# Patient Record
Sex: Female | Born: 1955 | Race: Black or African American | Hispanic: No | Smoking: Former smoker
Health system: Southern US, Community
[De-identification: ages and names within clinical notes are randomized; demographics above are authoritative.]

## PROBLEM LIST (undated history)

## (undated) DIAGNOSIS — E079 Disorder of thyroid, unspecified: Secondary | ICD-10-CM

## (undated) HISTORY — DX: Disorder of thyroid, unspecified: E07.9

## (undated) HISTORY — PX: THYROIDECTOMY: SHX17

---

## 2019-08-16 ENCOUNTER — Other Ambulatory Visit: Payer: Self-pay | Admitting: Internal Medicine

## 2019-08-16 DIAGNOSIS — Z13820 Encounter for screening for osteoporosis: Secondary | ICD-10-CM

## 2019-08-16 DIAGNOSIS — Z1231 Encounter for screening mammogram for malignant neoplasm of breast: Secondary | ICD-10-CM

## 2019-08-29 ENCOUNTER — Ambulatory Visit
Admission: RE | Admit: 2019-08-29 | Discharge: 2019-08-29 | Disposition: A | Discharge status: Home / Self Care | Payer: Medicare (Managed Care) | Source: Ambulatory Visit | Attending: Internal Medicine | Admitting: Internal Medicine

## 2019-08-29 ENCOUNTER — Other Ambulatory Visit: Payer: Self-pay

## 2019-08-29 DIAGNOSIS — Z1231 Encounter for screening mammogram for malignant neoplasm of breast: Secondary | ICD-10-CM

## 2020-02-12 ENCOUNTER — Ambulatory Visit
Admission: RE | Admit: 2020-02-12 | Discharge: 2020-02-12 | Disposition: A | Discharge status: Home / Self Care | Payer: Medicare (Managed Care) | Source: Ambulatory Visit | Attending: Internal Medicine | Admitting: Internal Medicine

## 2020-02-12 ENCOUNTER — Other Ambulatory Visit: Payer: Self-pay

## 2020-02-12 DIAGNOSIS — Z13820 Encounter for screening for osteoporosis: Secondary | ICD-10-CM

## 2020-08-09 DIAGNOSIS — M859 Disorder of bone density and structure, unspecified: Secondary | ICD-10-CM | POA: Diagnosis not present

## 2020-08-09 DIAGNOSIS — Z0000 Encounter for general adult medical examination without abnormal findings: Secondary | ICD-10-CM | POA: Diagnosis not present

## 2020-08-15 ENCOUNTER — Other Ambulatory Visit: Payer: Self-pay | Admitting: Internal Medicine

## 2020-08-15 DIAGNOSIS — Z0000 Encounter for general adult medical examination without abnormal findings: Secondary | ICD-10-CM | POA: Diagnosis not present

## 2020-08-15 DIAGNOSIS — Z1331 Encounter for screening for depression: Secondary | ICD-10-CM | POA: Diagnosis not present

## 2020-08-15 DIAGNOSIS — Z1339 Encounter for screening examination for other mental health and behavioral disorders: Secondary | ICD-10-CM | POA: Diagnosis not present

## 2020-08-15 DIAGNOSIS — E049 Nontoxic goiter, unspecified: Secondary | ICD-10-CM | POA: Diagnosis not present

## 2020-08-15 DIAGNOSIS — E663 Overweight: Secondary | ICD-10-CM | POA: Diagnosis not present

## 2020-08-15 DIAGNOSIS — Z23 Encounter for immunization: Secondary | ICD-10-CM | POA: Diagnosis not present

## 2020-08-15 DIAGNOSIS — Z6829 Body mass index (BMI) 29.0-29.9, adult: Secondary | ICD-10-CM | POA: Diagnosis not present

## 2020-10-02 ENCOUNTER — Ambulatory Visit
Admission: RE | Admit: 2020-10-02 | Discharge: 2020-10-02 | Disposition: A | Discharge status: Home / Self Care | Payer: Medicare (Managed Care) | Source: Ambulatory Visit | Attending: Internal Medicine | Admitting: Internal Medicine

## 2020-10-02 DIAGNOSIS — E041 Nontoxic single thyroid nodule: Secondary | ICD-10-CM | POA: Diagnosis not present

## 2020-10-02 DIAGNOSIS — E049 Nontoxic goiter, unspecified: Secondary | ICD-10-CM

## 2020-10-07 DIAGNOSIS — Z7722 Contact with and (suspected) exposure to environmental tobacco smoke (acute) (chronic): Secondary | ICD-10-CM | POA: Diagnosis not present

## 2020-10-07 DIAGNOSIS — Z809 Family history of malignant neoplasm, unspecified: Secondary | ICD-10-CM | POA: Diagnosis not present

## 2020-10-07 DIAGNOSIS — R030 Elevated blood-pressure reading, without diagnosis of hypertension: Secondary | ICD-10-CM | POA: Diagnosis not present

## 2020-10-07 DIAGNOSIS — I951 Orthostatic hypotension: Secondary | ICD-10-CM | POA: Diagnosis not present

## 2020-10-07 DIAGNOSIS — Z87891 Personal history of nicotine dependence: Secondary | ICD-10-CM | POA: Diagnosis not present

## 2020-10-07 DIAGNOSIS — E669 Obesity, unspecified: Secondary | ICD-10-CM | POA: Diagnosis not present

## 2020-10-07 DIAGNOSIS — Z6830 Body mass index (BMI) 30.0-30.9, adult: Secondary | ICD-10-CM | POA: Diagnosis not present

## 2020-10-28 ENCOUNTER — Other Ambulatory Visit: Payer: Self-pay | Admitting: Internal Medicine

## 2020-10-28 DIAGNOSIS — Z1231 Encounter for screening mammogram for malignant neoplasm of breast: Secondary | ICD-10-CM

## 2020-11-21 DIAGNOSIS — E042 Nontoxic multinodular goiter: Secondary | ICD-10-CM | POA: Diagnosis not present

## 2020-12-05 DIAGNOSIS — E042 Nontoxic multinodular goiter: Secondary | ICD-10-CM | POA: Diagnosis not present

## 2020-12-18 ENCOUNTER — Ambulatory Visit: Payer: Medicare HMO

## 2020-12-23 ENCOUNTER — Ambulatory Visit (INDEPENDENT_AMBULATORY_CARE_PROVIDER_SITE_OTHER): Payer: Medicare HMO | Admitting: Endocrinology

## 2020-12-23 ENCOUNTER — Other Ambulatory Visit: Payer: Self-pay

## 2020-12-23 ENCOUNTER — Encounter: Payer: Self-pay | Admitting: Endocrinology

## 2020-12-23 DIAGNOSIS — E042 Nontoxic multinodular goiter: Secondary | ICD-10-CM | POA: Diagnosis not present

## 2020-12-23 NOTE — Patient Instructions (Signed)
Please come back for a follow-up appointment in 6 weeks (after the surgery).

## 2020-12-23 NOTE — Progress Notes (Addendum)
   Subjective:    Patient ID: Stephanie Parker, female    DOB: 1955-09-25, 65 y.o.   MRN: [id number]  HPI Pt is referred by Dr Jacalyn Lefevre, for nodular thyroid.  Pt was noted to have a thyroid nodule in 2021.   she has no h/o XRT or surgery to the neck. She does not notice the goiter   Social History   Socioeconomic History   Marital status: Widowed    Spouse name: Not on file   Number of children: Not on file   Years of education: Not on file   Highest education level: Not on file  Occupational History   Not on file  Tobacco Use   Smoking status: Not on file   Smokeless tobacco: Not on file  Substance and Sexual Activity   Alcohol use: Not on file   Drug use: Not on file   Sexual activity: Not on file  Other Topics Concern   Not on file  Social History Narrative   Not on file   Social Determinants of Health   Financial Resource Strain: Not on file  Food Insecurity: Not on file  Transportation Needs: Not on file  Physical Activity: Not on file  Stress: Not on file  Social Connections: Not on file  Intimate Partner Violence: Not on file    No current outpatient medications on file prior to visit.   No current facility-administered medications on file prior to visit.    No Known Allergies  Family History  Problem Relation Age of Onset   Thyroid disease Neg Hx     BP 120/60 (BP Location: Right Arm, Patient Position: Sitting, Cuff Size: Normal)   Pulse 86   Ht 5\' 4"  (1.626 m)   Wt 190 lb 6.4 oz (86.4 kg)   SpO2 94%   BMI 32.68 kg/m    Review of Systems Denies hoarseness, neck pain, sob.  She has gained a few lbs.     Objective:   Physical Exam NECK: thyroids is severely enlarged, with irreg surface, but I can't tell details.     [place] (2022):  Massively enlarged, heterogeneous, lobular and multinodular goiter with numerous similar appearing thyroid nodules and regions of pseudo nodularity. 2. At least 4 nodules technically meet criteria to consider fine-needle  aspiration biopsy given their very large size. However, current recommendations suggest that no more than 2 nodules be biopsied at a time. Therefore, nodules #9 (2.9 cm TI-RADS category 4 nodule in the left mid gland) and nodule #2 (4.5 cm TI-RADS category 3 nodule in the mid isthmus) should be considered for fine-needle aspiration biopsy. The remaining nodules can be followed with repeat thyroid ultrasound in 1 year.  TSH=0.69 Cytol (2022): both nodules are cat 1.  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  she was advised to have surgery, but she requested to discuss with Dr Jacalyn Lefevre first.      Assessment & Plan:  MNG, new to me.  uncertain etiology and prognosis.  We discussed options.  Pt chooses surgery over repeat bx.   Patient Instructions  Please come back for a follow-up appointment in 6 weeks (after the surgery).

## 2020-12-24 DIAGNOSIS — E042 Nontoxic multinodular goiter: Secondary | ICD-10-CM | POA: Insufficient documentation

## 2021-01-27 DIAGNOSIS — E042 Nontoxic multinodular goiter: Secondary | ICD-10-CM | POA: Diagnosis not present

## 2021-02-05 ENCOUNTER — Other Ambulatory Visit: Payer: Self-pay

## 2021-02-05 ENCOUNTER — Ambulatory Visit (INDEPENDENT_AMBULATORY_CARE_PROVIDER_SITE_OTHER): Payer: Medicare HMO | Admitting: Endocrinology

## 2021-02-05 VITALS — BP 156/80 | HR 98 | Ht 64.0 in | Wt 191.6 lb

## 2021-02-05 DIAGNOSIS — E042 Nontoxic multinodular goiter: Secondary | ICD-10-CM

## 2021-02-05 LAB — BASIC METABOLIC PANEL
BUN: 8 mg/dL (ref 6–23)
CO2: 27 mEq/L (ref 19–32)
Calcium: 9.3 mg/dL (ref 8.4–10.5)
Chloride: 103 mEq/L (ref 96–112)
Creatinine, Ser: 0.63 mg/dL (ref 0.40–1.20)
GFR: 91.89 mL/min (ref >60.00)
Glucose, Bld: 90 mg/dL (ref 70–99)
Potassium: 3.6 mEq/L (ref 3.5–5.1)
Sodium: 139 mEq/L (ref 135–145)

## 2021-02-05 LAB — TSH: TSH: 0.43 u[IU]/mL (ref 0.35–5.50)

## 2021-02-05 LAB — T4, FREE: Free T4: 1.28 ng/dL (ref 0.60–1.60)

## 2021-02-05 MED ORDER — LEVOTHYROXINE SODIUM 137 MCG PO TABS: 137.0000 ug | ORAL_TABLET | Freq: Every day | ORAL | 3 refills | Status: DC

## 2021-02-05 NOTE — Progress Notes (Signed)
   Subjective:    Patient ID: Stephanie Parker, female    DOB: 02-01-1956, 65 y.o.   MRN: [id number]  HPI Pt returns for f/u of thyroidect for MNG (2022). pt states she feels well in general, except slight numbness at the scar area.  She had thyroidect 9 days ago.  She takes Ca++/vit-D pills.  Social History   Socioeconomic History   Marital status: Widowed    Spouse name: Not on file   Number of children: Not on file   Years of education: Not on file   Highest education level: Not on file  Occupational History   Not on file  Tobacco Use   Smoking status: Not on file   Smokeless tobacco: Not on file  Substance and Sexual Activity   Alcohol use: Not on file   Drug use: Not on file   Sexual activity: Not on file  Other Topics Concern   Not on file  Social History Narrative   Not on file   Social Determinants of Health   Financial Resource Strain: Not on file  Food Insecurity: Not on file  Transportation Needs: Not on file  Physical Activity: Not on file  Stress: Not on file  Social Connections: Not on file  Intimate Partner Violence: Not on file    No current outpatient medications on file prior to visit.   No current facility-administered medications on file prior to visit.    No Known Allergies  Family History  Problem Relation Age of Onset   Thyroid disease Neg Hx     BP (!) 156/80 (BP Location: Right Arm, Patient Position: Sitting, Cuff Size: Large)   Pulse 98   Ht 5\' 4"  (1.626 m)   Wt 191 lb 9.6 oz (86.9 kg)   SpO2 96%   BMI 32.89 kg/m    Review of Systems Denies acral tingling    Objective:   Physical Exam VITAL SIGNS:  See vs page.   GENERAL: no distress.   Neck: a healing scar is present.  I do not appreciate a nodule in the thyroid or elsewhere in the neck.     Pathol: THYROID, LEFT AND ISTHMUS, LOBECTOMY:              Multinodular hyperplasia.    B. THYROID, RIGHT, LOBECTOMY:              Multinodular hyperplasia.   Lab Results   Component Value Date   TSH 0.43 02/05/2021      Assessment & Plan:  Hypothyroidism: well-controlled.  Please continue the same synthroid Thyroidect: check BMET, including Ca++.

## 2021-02-05 NOTE — Patient Instructions (Addendum)
Blood tests are requested for you today.  We'll let you know about the results.  The numbness at the scar area will go away with time.  I would be happy to see you back here as needed.

## 2021-02-20 DIAGNOSIS — E890 Postprocedural hypothyroidism: Secondary | ICD-10-CM | POA: Diagnosis not present

## 2021-02-20 DIAGNOSIS — Z4889 Encounter for other specified surgical aftercare: Secondary | ICD-10-CM | POA: Diagnosis not present

## 2021-04-02 DIAGNOSIS — M542 Cervicalgia: Secondary | ICD-10-CM | POA: Diagnosis not present

## 2021-05-01 DIAGNOSIS — Z9889 Other specified postprocedural states: Secondary | ICD-10-CM | POA: Diagnosis not present

## 2021-05-01 DIAGNOSIS — E042 Nontoxic multinodular goiter: Secondary | ICD-10-CM | POA: Diagnosis not present

## 2021-06-20 DIAGNOSIS — E890 Postprocedural hypothyroidism: Secondary | ICD-10-CM | POA: Diagnosis not present

## 2021-06-20 DIAGNOSIS — Z79899 Other long term (current) drug therapy: Secondary | ICD-10-CM | POA: Diagnosis not present

## 2021-06-26 ENCOUNTER — Encounter: Payer: Self-pay | Admitting: Internal Medicine

## 2021-08-04 ENCOUNTER — Ambulatory Visit (AMBULATORY_SURGERY_CENTER): Payer: Medicaid Other | Admitting: *Deleted

## 2021-08-04 VITALS — Ht 64.0 in | Wt 196.0 lb

## 2021-08-04 DIAGNOSIS — Z1211 Encounter for screening for malignant neoplasm of colon: Secondary | ICD-10-CM

## 2021-08-04 MED ORDER — PEG 3350-KCL-NA BICARB-NACL 420 G PO SOLR: 4000.0000 mL | Freq: Once | ORAL | 0 refills | Status: AC

## 2021-08-04 NOTE — Progress Notes (Signed)

## 2021-08-25 ENCOUNTER — Ambulatory Visit (AMBULATORY_SURGERY_CENTER): Payer: Medicare Other | Admitting: Internal Medicine

## 2021-08-25 ENCOUNTER — Encounter: Payer: Self-pay | Admitting: Internal Medicine

## 2021-08-25 VITALS — BP 141/82 | HR 60 | Temp 96.9°F | Resp 13 | Ht 64.0 in | Wt 196.0 lb

## 2021-08-25 DIAGNOSIS — D125 Benign neoplasm of sigmoid colon: Secondary | ICD-10-CM

## 2021-08-25 DIAGNOSIS — K6389 Other specified diseases of intestine: Secondary | ICD-10-CM | POA: Diagnosis not present

## 2021-08-25 DIAGNOSIS — Z1211 Encounter for screening for malignant neoplasm of colon: Secondary | ICD-10-CM

## 2021-08-25 DIAGNOSIS — D123 Benign neoplasm of transverse colon: Secondary | ICD-10-CM

## 2021-08-25 MED ORDER — SODIUM CHLORIDE 0.9 % IV SOLN: 500.0000 mL | Freq: Once | INTRAVENOUS | Status: DC

## 2021-08-25 NOTE — Progress Notes (Signed)
   GASTROENTEROLOGY PROCEDURE H&P NOTE   Primary Care Physician: Michael Boston, MD    Reason for Procedure:   Colon cancer screening  Plan:    Colonoscopy  Patient is appropriate for endoscopic procedure(s) in the ambulatory ([place]) setting.  The nature of the procedure, as well as the risks, benefits, and alternatives were carefully and thoroughly reviewed with the patient. Ample time for discussion and questions allowed. The patient understood, was satisfied, and agreed to proceed.     HPI: Stephanie Parker is a 66 y.o. female who presents for colonoscopy for colon cancer screening. Denies blood in stools, changes in bowel habits, weight loss. Denies fam hx of colon cancer.  Past Medical History:  Diagnosis Date   Thyroid disease     Past Surgical History:  Procedure Laterality Date   THYROIDECTOMY     NOVEMBER,2022    Prior to Admission medications   Medication Sig Start Date End Date Taking? Authorizing Provider  Calcium Carb-Cholecalciferol (CALCIUM 500 + D3 PO) Take by mouth daily. TAKE 2 [place]    [provider]  levothyroxine (SYNTHROID) 112 MCG tablet Take 112 mcg by mouth daily. 07/04/21   [provider]    Current Outpatient Medications  Medication Sig Dispense Refill   Calcium Carb-Cholecalciferol (CALCIUM 500 + D3 PO) Take by mouth daily. TAKE 2 GUMMIES DAILY     levothyroxine (SYNTHROID) 112 MCG tablet Take 112 mcg by mouth daily.     No current facility-administered medications for this visit.    Allergies as of 08/25/2021   (No Known Allergies)    Family History  Problem Relation Age of Onset   Esophageal cancer Brother    Thyroid disease Neg Hx    Colon cancer Neg Hx    Colon polyps Neg Hx    Crohn's disease Neg Hx    Rectal cancer Neg Hx    Stomach cancer Neg Hx     Social History   Socioeconomic History   Marital status: Widowed    Spouse name: Not on file   Number of children: Not on file   Years of  education: Not on file   Highest education level: Not on file  Occupational History   Not on file  Tobacco Use   Smoking status: Former    Types: Cigarettes    Passive exposure: Never   Smokeless tobacco: Never  Vaping Use   Vaping Use: Never used  Substance and Sexual Activity   Alcohol use: Not Currently   Drug use: Never   Sexual activity: Not on file  Other Topics Concern   Not on file  Social History Narrative   Not on file   Social Determinants of Health   Financial Resource Strain: Not on file  Food Insecurity: Not on file  Transportation Needs: Not on file  Physical Activity: Not on file  Stress: Not on file  Social Connections: Not on file  Intimate Partner Violence: Not on file    Physical Exam: Vital signs in last 24 hours: There were no vitals taken for this visit. GEN: NAD EYE: Sclerae anicteric ENT: MMM CV: Non-tachycardic Pulm: No increased work of breathing GI: Soft, NT/ND NEURO:  Alert & Oriented   Christia Reading, MD [facility]  08/25/2021 8:35 AM

## 2021-08-25 NOTE — Progress Notes (Signed)
Called to room to assist during endoscopic procedure.  Patient ID and intended procedure confirmed with present staff. Received instructions for my participation in the procedure from the performing physician.  

## 2021-08-25 NOTE — Progress Notes (Signed)
Pt non-responsive, VVS, Report to RN  °

## 2021-08-25 NOTE — Patient Instructions (Signed)
Hand out given for polyps.  Await pathology results.  Resume previous diet and current medications.   YOU HAD AN ENDOSCOPIC PROCEDURE TODAY AT [facility]:   Refer to the procedure report that was given to you for any specific questions about what was found during the examination.  If the procedure report does not answer your questions, please call your gastroenterologist to clarify.  If you requested that your care partner not be given the details of your procedure findings, then the procedure report has been included in a sealed envelope for you to review at your convenience later.  YOU SHOULD EXPECT: Some feelings of bloating in the abdomen. Passage of more gas than usual.  Walking can help get rid of the air that was put into your GI tract during the procedure and reduce the bloating. If you had a lower endoscopy (such as a colonoscopy or flexible sigmoidoscopy) you may notice spotting of blood in your stool or on the toilet paper. If you underwent a bowel prep for your procedure, you may not have a normal bowel movement for a few days.  Please Note:  You might notice some irritation and congestion in your nose or some drainage.  This is from the oxygen used during your procedure.  There is no need for concern and it should clear up in a day or so.  SYMPTOMS TO REPORT IMMEDIATELY:  Following lower endoscopy (colonoscopy or flexible sigmoidoscopy):  Excessive amounts of blood in the stool  Significant tenderness or worsening of abdominal pains  Swelling of the abdomen that is new, acute  Fever of 100F or higher   For urgent or emergent issues, a gastroenterologist can be reached at any hour by calling [phone]. Do not use MyChart messaging for urgent concerns.    DIET:  We do recommend a small meal at first, but then you may proceed to your regular diet.  Drink plenty of fluids but you should avoid alcoholic beverages for 24 hours.  ACTIVITY:  You should plan to take  it easy for the rest of today and you should NOT DRIVE or use heavy machinery until tomorrow (because of the sedation medicines used during the test).    FOLLOW UP: Our staff will call the number listed on your records 24-72 hours following your procedure to check on you and address any questions or concerns that you may have regarding the information given to you following your procedure. If we do not reach you, we will leave a message.  We will attempt to reach you two times.  During this call, we will ask if you have developed any symptoms of COVID 19. If you develop any symptoms (ie: fever, flu-like symptoms, shortness of breath, cough etc.) before then, please call [phone].  If you test positive for Covid 19 in the 2 weeks post procedure, please call and report this information to [place].    If any biopsies were taken you will be contacted by phone or by letter within the next 1-3 weeks.  Please call us at [phone] if you have not heard about the biopsies in 3 weeks.    SIGNATURES/CONFIDENTIALITY: You and/or your care partner have signed paperwork which will be entered into your electronic medical record.  These signatures attest to the fact that that the information above on your After Visit Summary has been reviewed and is understood.  Full responsibility of the confidentiality of this discharge information lies with you and/or your care-partner.

## 2021-08-25 NOTE — Progress Notes (Signed)
Pt's states no medical or surgical changes since previsit or office visit. 

## 2021-08-25 NOTE — Op Note (Signed)
[facility] Patient Name: [name] Procedure Date: 08/25/2021 9:16 AM MRN: [id number] Endoscopist: Sonny Masters "Stephanie Parker ,  Age: 66 Referring MD:  Date of Birth: May 07, 1955 Gender: Female Account #: [id number] Procedure:                Colonoscopy Indications:              Screening for colorectal malignant neoplasm, This                            is the patient's first colonoscopy Medicines:                Monitored Anesthesia Care Procedure:                Pre-Anesthesia Assessment:                           - Prior to the procedure, a History and Physical                            was performed, and patient medications and                            allergies were reviewed. The patient's tolerance of                            previous anesthesia was also reviewed. The risks                            and benefits of the procedure and the sedation                            options and risks were discussed with the patient.                            All questions were answered, and informed consent                            was obtained. Prior Anticoagulants: The patient has                            taken no previous anticoagulant or antiplatelet                            agents. ASA Grade Assessment: II - A patient with                            mild systemic disease. After reviewing the risks                            and benefits, the patient was deemed in                            satisfactory condition to undergo the procedure.  After obtaining informed consent, the colonoscope                            was passed under direct vision. Throughout the                            procedure, the patient's blood pressure, pulse, and                            oxygen saturations were monitored continuously. The                            CF [id number] #[id number] was introduced through the anus                            and advanced to the  the terminal ileum. The                            colonoscopy was performed without difficulty. The                            patient tolerated the procedure well. The quality                            of the bowel preparation was adequate. The terminal                            ileum, ileocecal valve, appendiceal orifice, and                            rectum were photographed. Scope In: 9:21:56 AM Scope Out: 9:51:53 AM Scope Withdrawal Time: 0 hours 26 minutes 25 seconds  Total Procedure Duration: 0 hours 29 minutes 57 seconds  Findings:                 The terminal ileum appeared normal.                           Four sessile polyps were found in the transverse                            colon. The polyps were 3 to 4 mm in size. These                            polyps were removed with a cold snare. Resection                            and retrieval were complete.                           Multiple small and large-mouthed diverticula were                            found in the sigmoid colon and descending colon.  A 10 mm polyp was found in the sigmoid colon. The                            polyp was pedunculated. The polyp was removed with                            a hot snare. Resection and retrieval were complete.                            Area distal and on the opposite wall was tattooed                            with an injection of Spot (carbon black).                           Non-bleeding internal hemorrhoids were found during                            retroflexion. Complications:            No immediate complications. Estimated Blood Loss:     Estimated blood loss was minimal. Impression:               - The examined portion of the ileum was normal.                           - Four 3 to 4 mm polyps in the transverse colon,                            removed with a cold snare. Resected and retrieved.                           - Diverticulosis in  the sigmoid colon and in the                            descending colon.                           - One 10 mm polyp in the sigmoid colon, removed                            with a hot snare. Resected and retrieved. Tattooed.                           - Non-bleeding internal hemorrhoids. Recommendation:           - Discharge patient to home (with escort).                           - Await pathology results.                           - The findings and recommendations were discussed  with the patient. Sonny Masters "Stephanie Parker,  08/25/2021 9:58:09 AM

## 2021-08-26 ENCOUNTER — Telehealth: Payer: Self-pay

## 2021-08-26 NOTE — Telephone Encounter (Signed)
  Follow up Call-     08/25/2021    8:47 AM  Call back number  Post procedure Call Back phone  # [phone]  Permission to leave phone message Yes     Patient questions:  Do you have a fever, pain , or abdominal swelling? No. Pain Score  0 *  Have you tolerated food without any problems? Yes.    Have you been able to return to your normal activities? Yes.    Do you have any questions about your discharge instructions: Diet   No. Medications  No. Follow up visit  No.  Do you have questions or concerns about your Care? No.  Actions: * If pain score is 4 or above: No action needed, pain <4.

## 2021-08-28 ENCOUNTER — Encounter: Payer: Self-pay | Admitting: Internal Medicine

## 2021-08-28 ENCOUNTER — Ambulatory Visit: Payer: Medicare Other

## 2021-09-02 ENCOUNTER — Ambulatory Visit
Admission: RE | Admit: 2021-09-02 | Discharge: 2021-09-02 | Disposition: A | Discharge status: Home / Self Care | Payer: Medicare Other | Source: Ambulatory Visit | Attending: Internal Medicine | Admitting: Internal Medicine

## 2021-09-02 DIAGNOSIS — Z1231 Encounter for screening mammogram for malignant neoplasm of breast: Secondary | ICD-10-CM

## 2022-06-21 IMAGING — US US THYROID
1 series · 12 of 25 positions shown · non-contrast
Comparison: None.

CLINICAL DATA: Palpable abnormality.  Thyromegaly

EXAM:
THYROID ULTRASOUND
TECHNIQUE: Ultrasound examination of the thyroid gland and adjacent soft
tissues was performed.

[Series 1: us thyroid · 0.09mm/px · 12 of 57 slices shown]
[im 3/57]
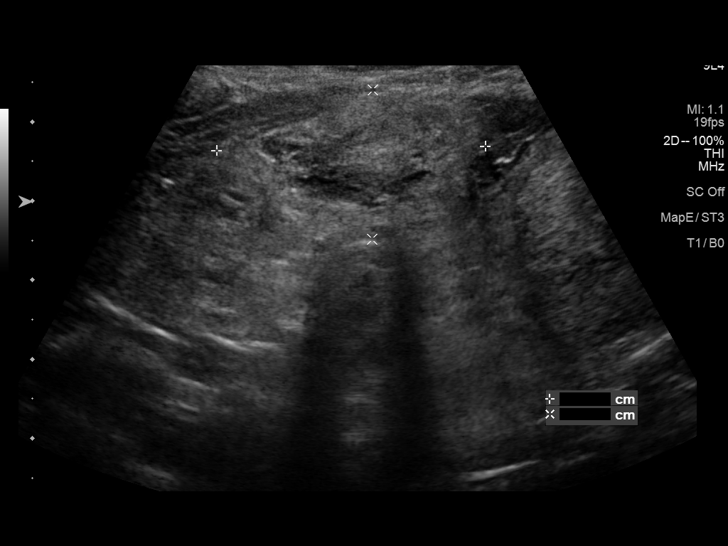
[im 8/57]
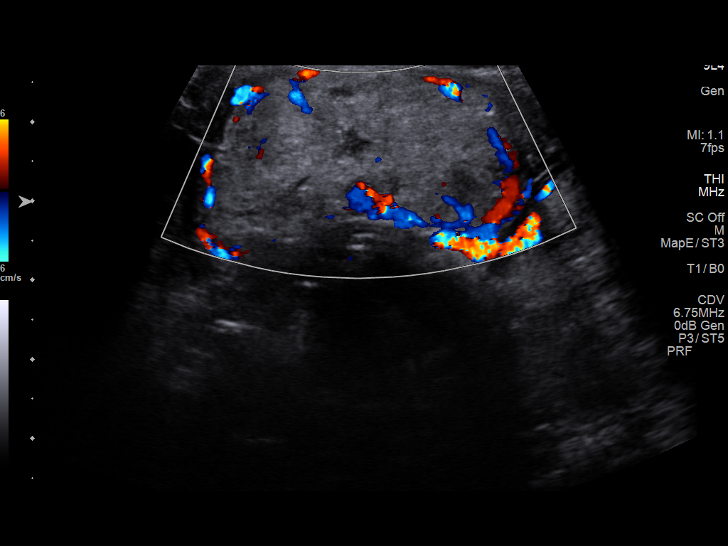
[im 12/57]
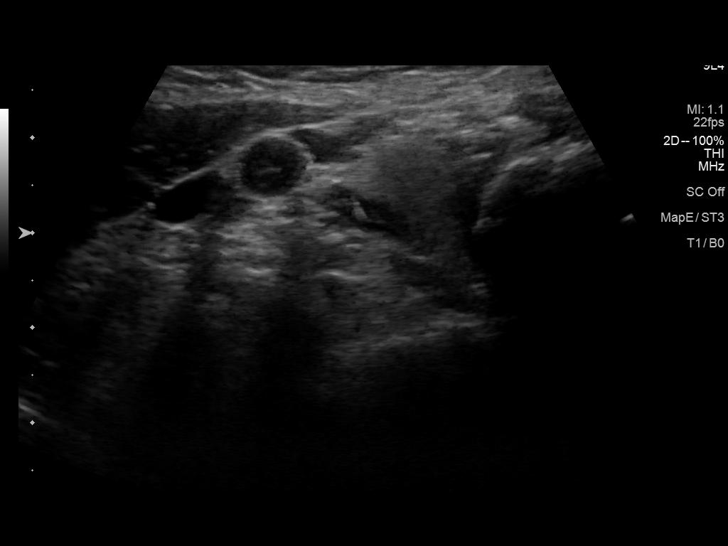
[im 17/57]
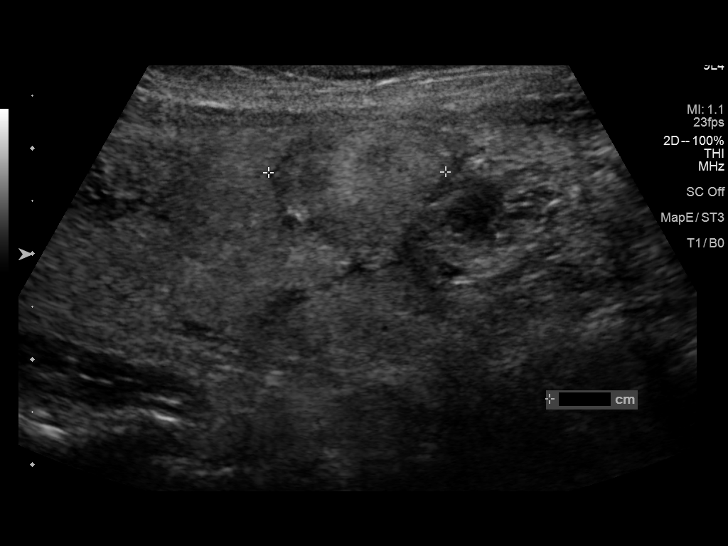
[im 22/57]
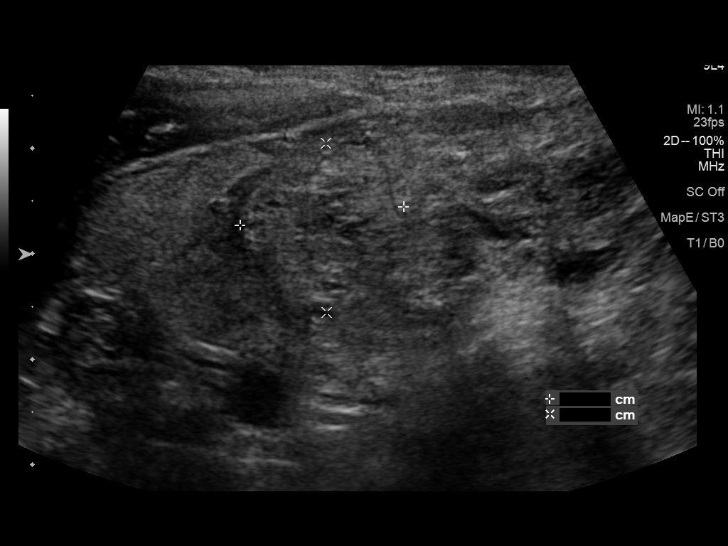
[im 26/57]
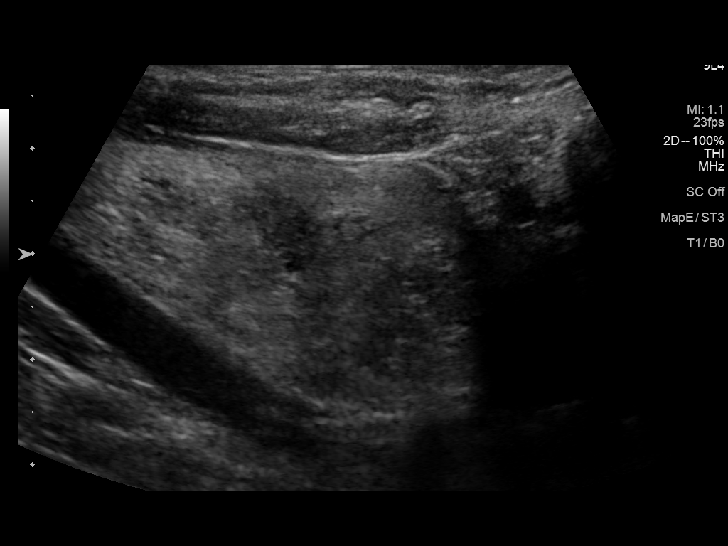
[im 31/57]
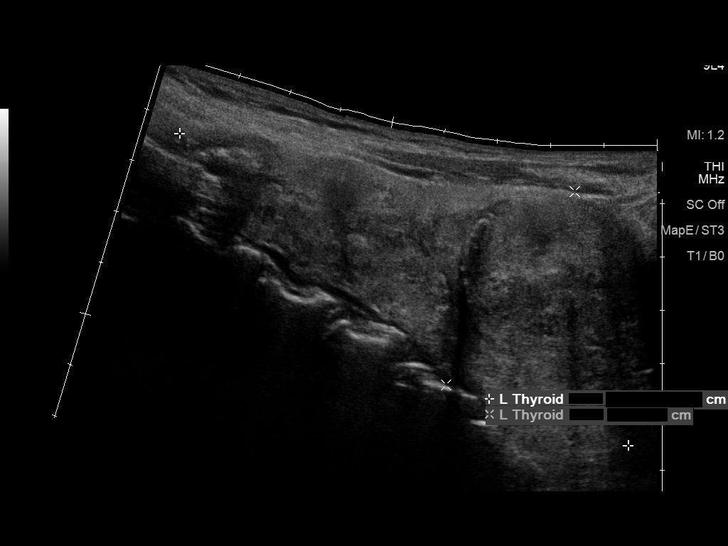
[im 36/57]
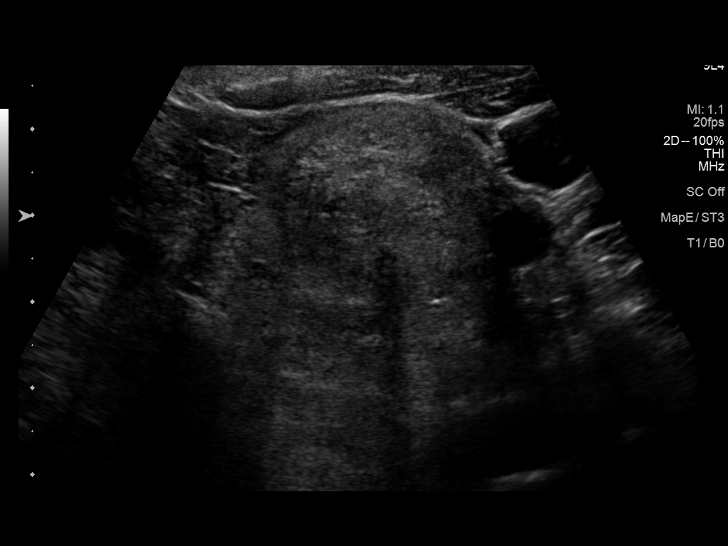
[im 40/57]
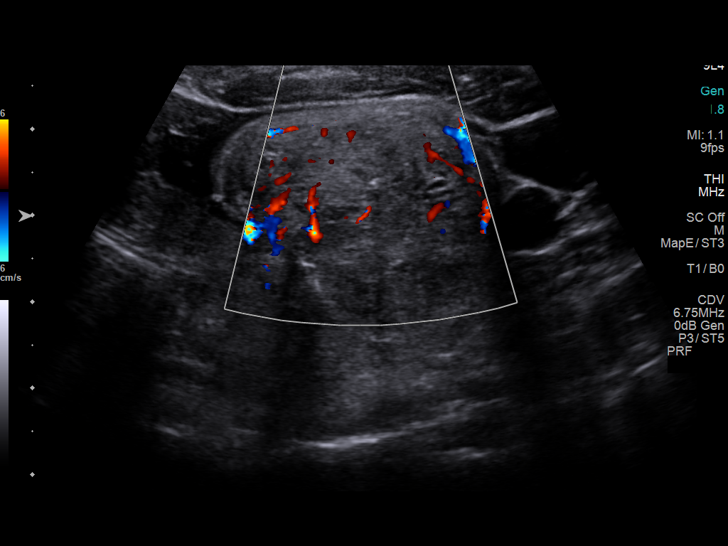
[im 45/57]
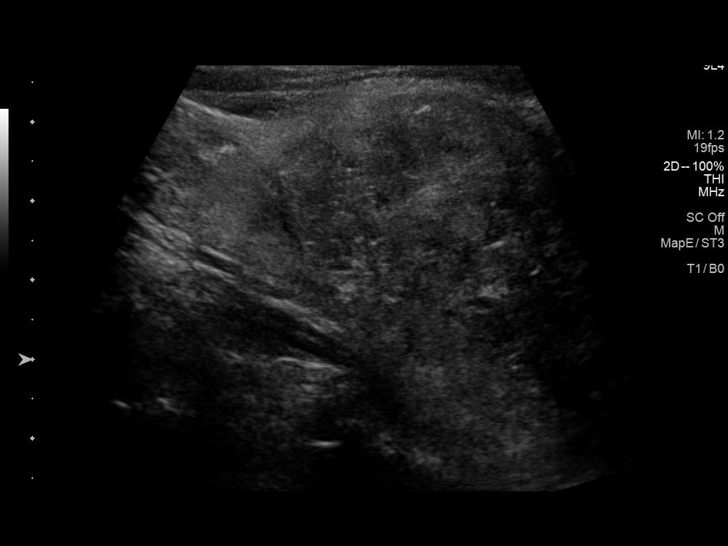
[im 50/57]
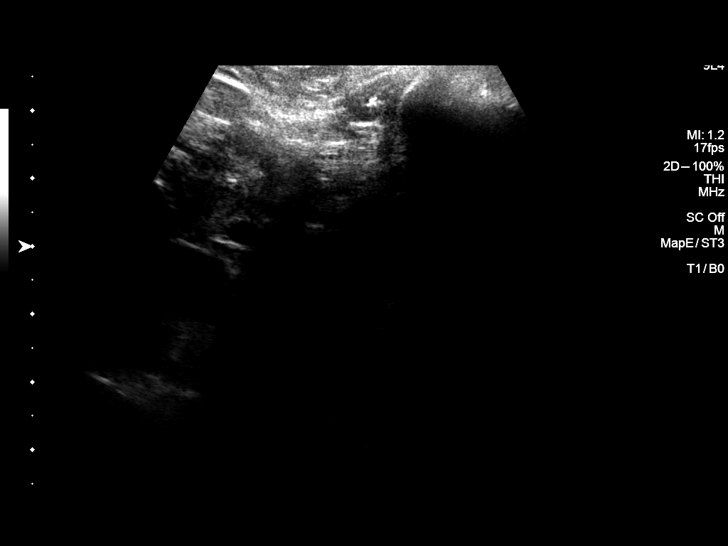
[im 54/57]
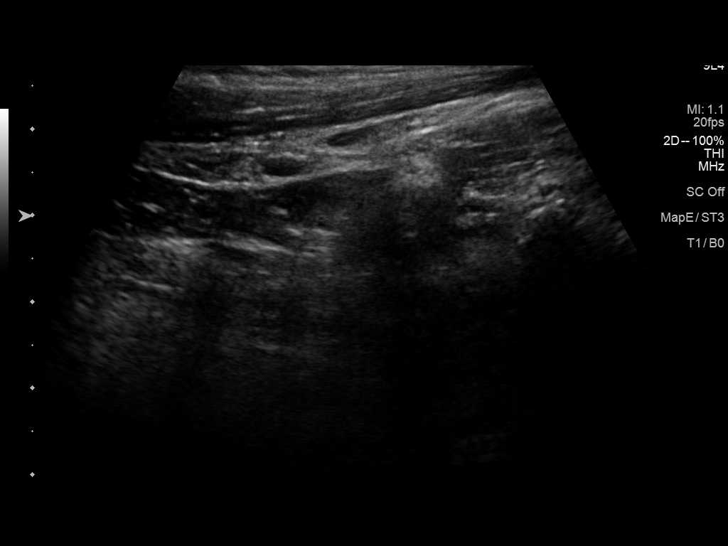

[12 of 25 positions shown; findings below may reference images not displayed]

FINDINGS: Parenchymal Echotexture: Markedly heterogenous

Isthmus: 2.1 cm

Right lobe: 8.6 x 2.6 x 3.8 cm

Left lobe: 10.2 x 4.3 x 3.8 cm

_________________________________________________________

Estimated total number of nodules >/= 1 cm: >10

Number of spongiform nodules >/=  2 cm not described below (TR1): 0

Number of mixed cystic and solid nodules >/= 1.5 cm not described
below (TR2): 0

_________________________________________________________

Diffusely heterogeneous, enlarged, lobular and multinodular thyroid
gland. The entire gland is replaced by nodules of varying sizes. As
per protocol, only the 4 most suspicious nodules will be catalogued.

Nodule # 1:

Location: Isthmus; Mid

Maximum size: 4.0 cm; Other 2 dimensions: 3.4 x 1.9 cm

Composition: solid/almost completely solid (2)

Echogenicity: isoechoic (1)

Shape: not taller-than-wide (0)

Margins: ill-defined (0)

Echogenic foci: none (0)

ACR TI-RADS total points: 3.

ACR TI-RADS risk category: TR3 (3 points).

ACR TI-RADS recommendations:

**Given size (>/= 2.5 cm) and appearance, fine needle aspiration of
this mildly suspicious nodule should be considered based on TI-RADS
criteria.

_________________________________________________________

Nodule # 2:

Location: Isthmus; Inferior

Maximum size: 4.5 cm; Other 2 dimensions: 3.1 x 2.0 cm

Composition: solid/almost completely solid (2)

Echogenicity: isoechoic (1)

Shape: not taller-than-wide (0)

Margins: ill-defined (0)

Echogenic foci: none (0)

ACR TI-RADS total points: 3.

ACR TI-RADS risk category: TR3 (3 points).

ACR TI-RADS recommendations:

**Given size (>/= 2.5 cm) and appearance, fine needle aspiration of
this mildly suspicious nodule should be considered based on TI-RADS
criteria.

_________________________________________________________

Nodule # 8:

Location: Left; Superior

Maximum size: 3.0 cm; Other 2 dimensions: 2.1 x 1.5 cm

Composition: solid/almost completely solid (2)

Echogenicity: isoechoic (1)

Shape: not taller-than-wide (0)

Margins: ill-defined (0)

Echogenic foci: none (0)

ACR TI-RADS total points: 3.

ACR TI-RADS risk category: TR3 (3 points).

ACR TI-RADS recommendations:

**Given size (>/= 2.5 cm) and appearance, fine needle aspiration of
this mildly suspicious nodule should be considered based on TI-RADS
criteria.

_________________________________________________________

Nodule # 9:

Location: Left; Mid

Maximum size: 2.9 cm; Other 2 dimensions:  2.8 x 2.7 cm

Composition: solid/almost completely solid (2)

Echogenicity: isoechoic (1)

Shape: not taller-than-wide (0)

Margins: ill-defined (0)

Echogenic foci: macrocalcifications (1)

ACR TI-RADS total points: 4.

ACR TI-RADS risk category: TR4 (4-6 points).

ACR TI-RADS recommendations:

**Given size (>/= 1.5 cm) and appearance, fine needle aspiration of
this moderately suspicious nodule should be considered based on
TI-RADS criteria.

_________________________________________________________

Numerous additional areas of pseudo nodularity and additional
smaller nodules with similar imaging characteristics to the TI-RADS
category 3 nodules above are present bilaterally. None of these
nodules are larger or demonstrate features more suspicious than the
4 described above.
IMPRESSION: 1. Massively enlarged, heterogeneous, lobular and multinodular
goiter with numerous similar appearing thyroid nodules and regions
of pseudo nodularity.
2. At least 4 nodules technically meet criteria to consider
fine-needle aspiration biopsy given their very large size. However,
current recommendations suggest that no more than 2 nodules be
biopsied at a time. Therefore, nodules #9 (2.9 cm TI-RADS category 4
nodule in the left mid gland) and nodule #2 (4.5 cm TI-RADS category
3 nodule in the mid isthmus) should be considered for fine-needle
aspiration biopsy. The remaining nodules can be followed with repeat
thyroid ultrasound in 1 year.

The above is in keeping with the ACR TI-RADS recommendations - [HOSPITAL] 8509;[DATE].

## 2022-08-25 ENCOUNTER — Other Ambulatory Visit: Payer: Self-pay | Admitting: Internal Medicine

## 2022-08-25 DIAGNOSIS — Z1231 Encounter for screening mammogram for malignant neoplasm of breast: Secondary | ICD-10-CM

## 2022-09-08 ENCOUNTER — Other Ambulatory Visit: Payer: Self-pay | Admitting: Internal Medicine

## 2022-09-08 DIAGNOSIS — M8580 Other specified disorders of bone density and structure, unspecified site: Secondary | ICD-10-CM

## 2022-09-22 ENCOUNTER — Ambulatory Visit: Payer: 59

## 2022-10-21 ENCOUNTER — Ambulatory Visit: Payer: 59

## 2023-05-22 IMAGING — MG MM DIGITAL SCREENING BILAT W/ TOMO AND CAD
8 series · 8 of 24 positions shown · non-contrast
Comparison: Previous exam(s).

CLINICAL DATA: Screening.

EXAM:
DIGITAL SCREENING BILATERAL MAMMOGRAM WITH TOMOSYNTHESIS AND CAD
TECHNIQUE: Bilateral screening digital craniocaudal and mediolateral oblique
mammograms were obtained. Bilateral screening digital breast
tomosynthesis was performed. The images were evaluated with
computer-aided detection.

[L CC synth-2D]
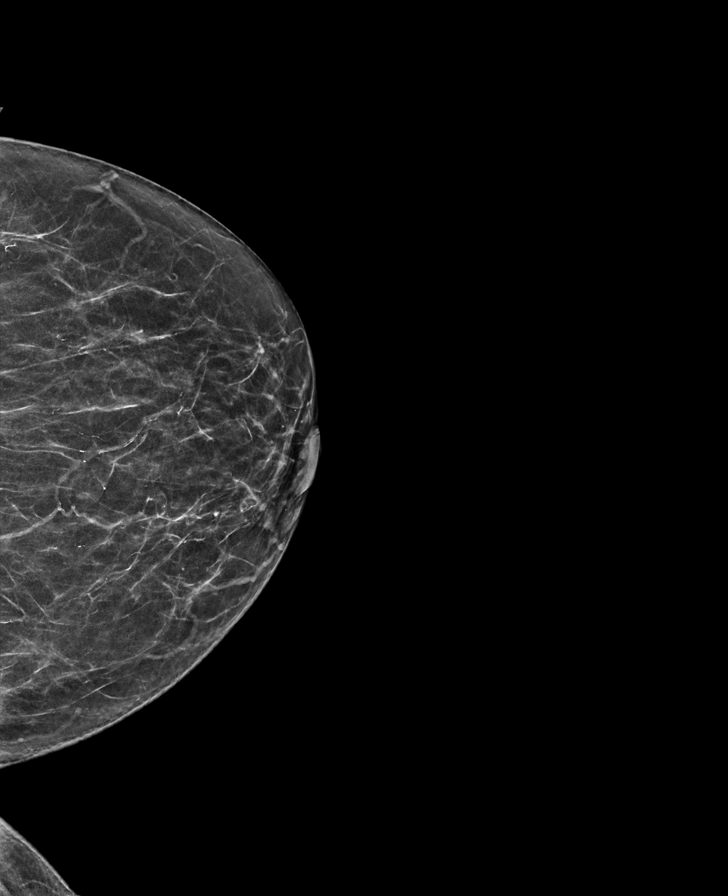

[R MLO synth-2D]
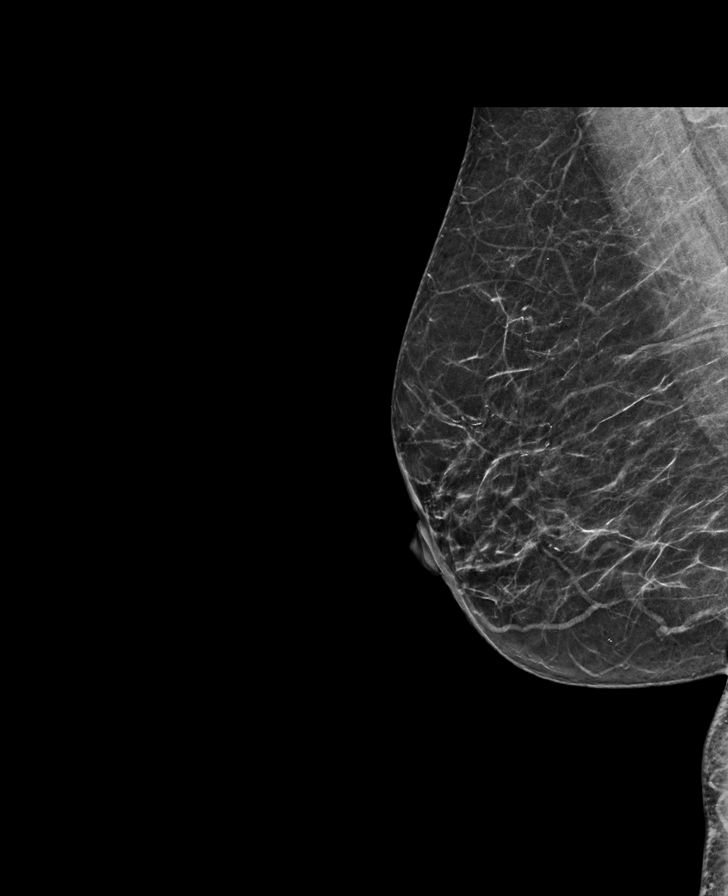

[R CC synth-2D]
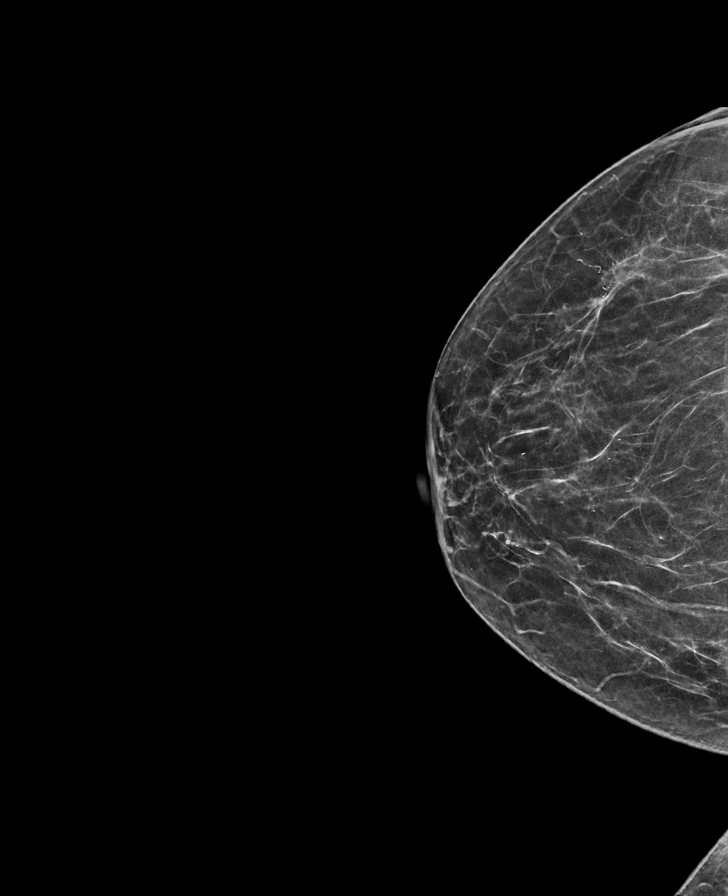

[L MLO synth-2D]
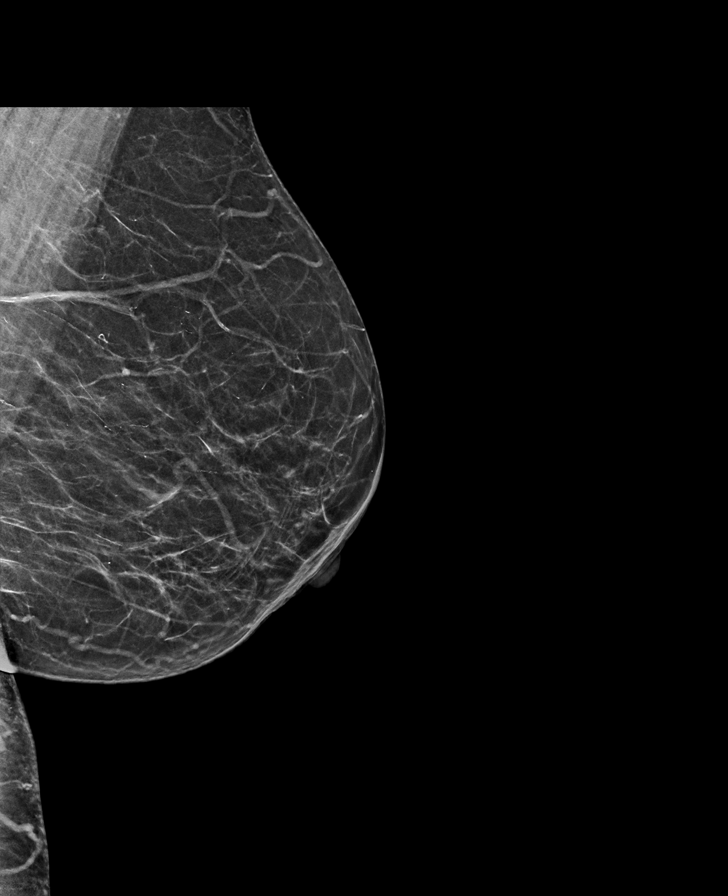

[R MLO tomo · tomo slice 25/50.0]
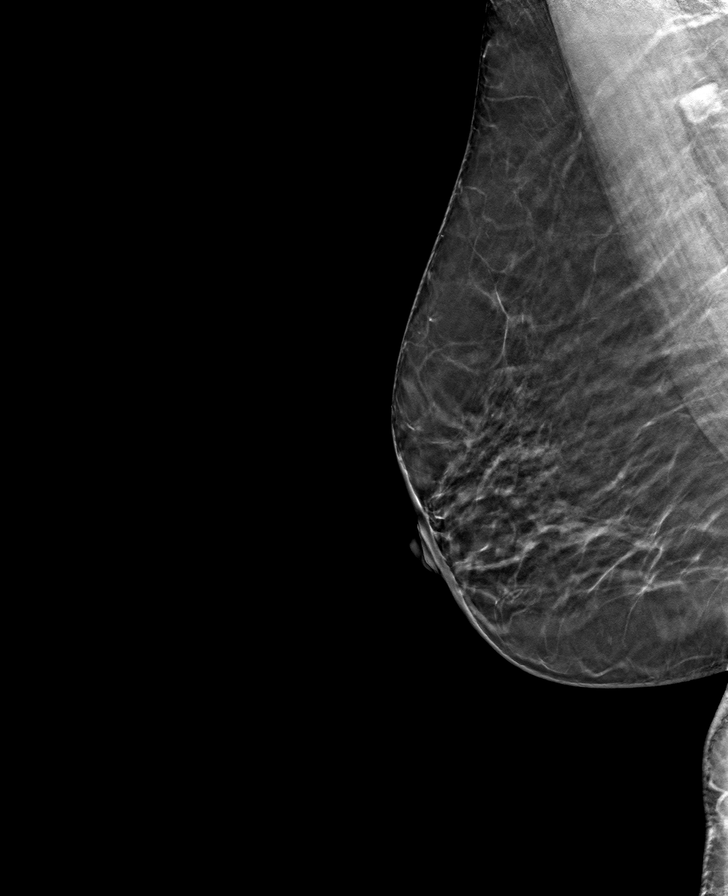

[L CC tomo · tomo slice 25/50.0]
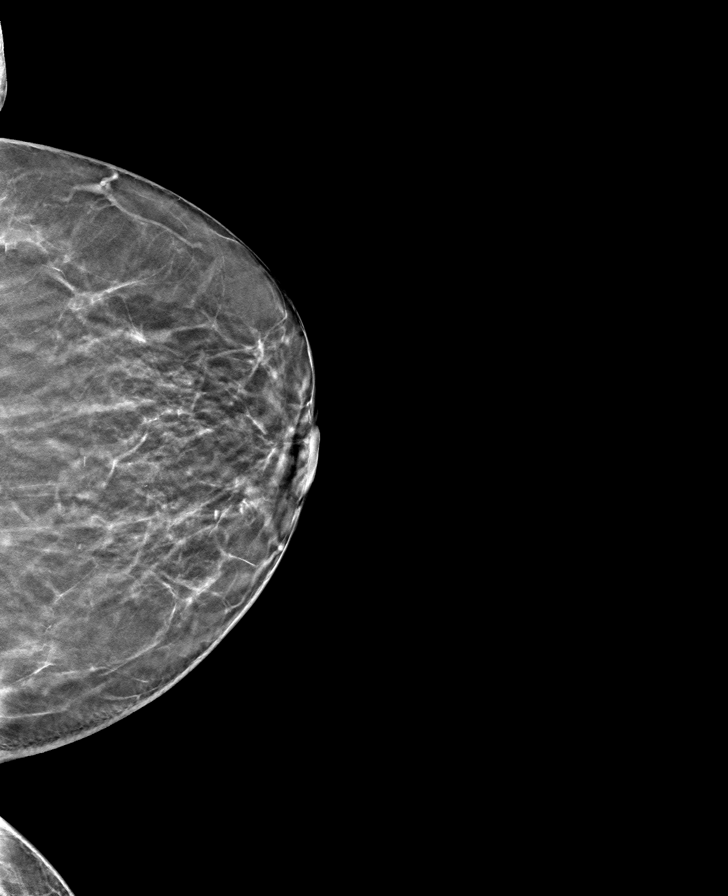

[R CC tomo · tomo slice 27/54.0]
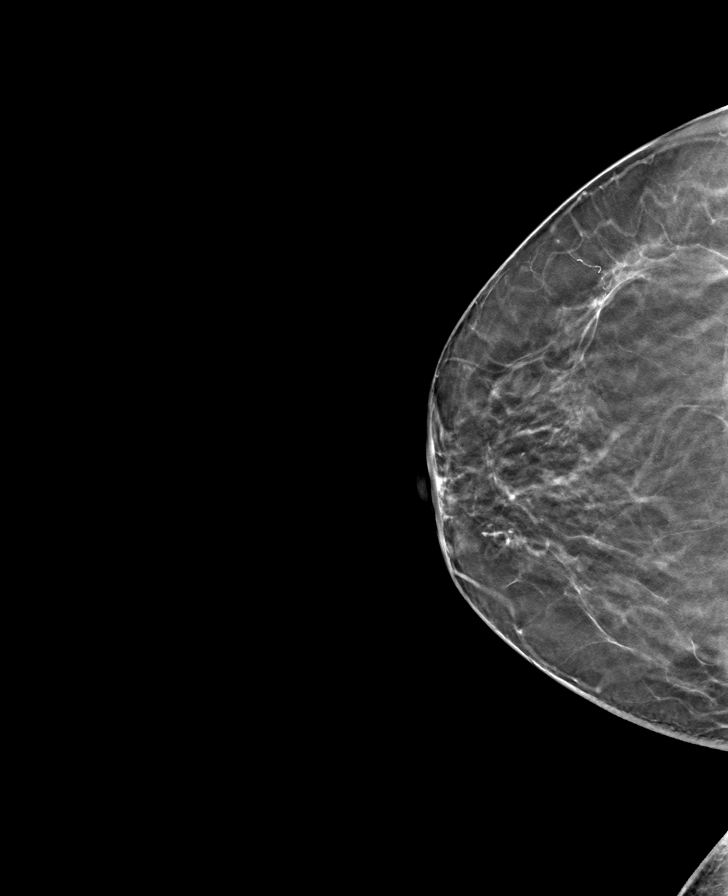

[L MLO tomo · tomo slice 27/53.0]
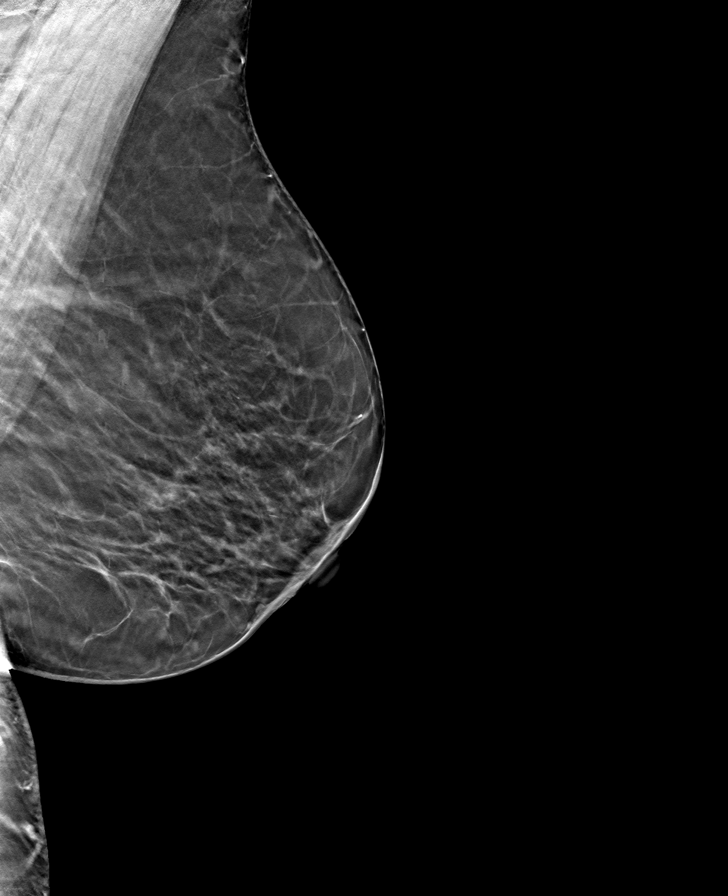

[8 of 24 positions shown; findings below may reference images not displayed]

ACR Breast Density Category b: There are scattered areas of
fibroglandular density.
FINDINGS: There are no findings suspicious for malignancy.
IMPRESSION: No mammographic evidence of malignancy. A result letter of this
screening mammogram will be mailed directly to the patient.

RECOMMENDATION:
Screening mammogram in one year. (Code:51-O-LD2)

BI-RADS CATEGORY  1: Negative.
